# Patient Record
Sex: Male | Born: 1990 | Hispanic: Yes | Marital: Single
Health system: Southern US, Community
[De-identification: ages and names within clinical notes are randomized; demographics above are authoritative.]

---
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
## 2011-10-27 ENCOUNTER — Ambulatory Visit: Payer: Self-pay | Admitting: Family Medicine
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
## 2022-07-02 ENCOUNTER — Other Ambulatory Visit: Payer: Self-pay

## 2022-07-02 ENCOUNTER — Emergency Department
Admission: EM | Admit: 2022-07-02 | Discharge: 2022-07-02 | Disposition: A | Discharge status: Home / Self Care | Payer: BC Managed Care – PPO | Attending: Emergency Medicine | Admitting: Emergency Medicine

## 2022-07-02 ENCOUNTER — Encounter: Payer: Self-pay | Admitting: *Deleted

## 2022-07-02 ENCOUNTER — Emergency Department: Payer: BC Managed Care – PPO

## 2022-07-02 DIAGNOSIS — S8991XA Unspecified injury of right lower leg, initial encounter: Secondary | ICD-10-CM | POA: Diagnosis present

## 2022-07-02 DIAGNOSIS — Z23 Encounter for immunization: Secondary | ICD-10-CM | POA: Diagnosis not present

## 2022-07-02 DIAGNOSIS — Y92410 Unspecified street and highway as the place of occurrence of the external cause: Secondary | ICD-10-CM | POA: Diagnosis not present

## 2022-07-02 DIAGNOSIS — S8001XA Contusion of right knee, initial encounter: Secondary | ICD-10-CM | POA: Diagnosis not present

## 2022-07-02 DIAGNOSIS — S50811A Abrasion of right forearm, initial encounter: Secondary | ICD-10-CM | POA: Insufficient documentation

## 2022-07-02 MED ORDER — TETANUS-DIPHTH-ACELL PERTUSSIS 5-2.5-18.5 LF-MCG/0.5 IM SUSY
0.5000 mL | PREFILLED_SYRINGE | Freq: Once | INTRAMUSCULAR | Status: AC
  Administered 2022-07-02: 0.5 mL via INTRAMUSCULAR
  Filled 2022-07-02: qty 0.5

## 2022-07-02 MED ORDER — KETOROLAC TROMETHAMINE 30 MG/ML IJ SOLN
30.0000 mg | Freq: Once | INTRAMUSCULAR | Status: AC
  Administered 2022-07-02: 30 mg via INTRAMUSCULAR
  Filled 2022-07-02: qty 1
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
